# Patient Record
Sex: Female | Born: 1965 | Race: White | Hispanic: No | Marital: Married | Smoking: Current every day smoker
Health system: Southern US, Community
[De-identification: ages and names within clinical notes are randomized; demographics above are authoritative.]

---

## 2007-08-04 ENCOUNTER — Ambulatory Visit: Payer: Self-pay | Admitting: Family Medicine

## 2009-10-26 ENCOUNTER — Ambulatory Visit: Payer: Self-pay | Admitting: Nurse Practitioner

## 2010-02-10 ENCOUNTER — Emergency Department: Payer: Self-pay | Admitting: Internal Medicine

## 2010-07-16 ENCOUNTER — Ambulatory Visit: Payer: Self-pay | Admitting: Urology

## 2010-07-31 ENCOUNTER — Ambulatory Visit: Payer: Self-pay | Admitting: Urology

## 2010-12-23 ENCOUNTER — Ambulatory Visit: Payer: Self-pay | Admitting: Physician Assistant

## 2011-05-23 ENCOUNTER — Emergency Department: Payer: Self-pay | Admitting: Unknown Physician Specialty

## 2011-06-16 ENCOUNTER — Ambulatory Visit: Payer: Self-pay | Admitting: Nurse Practitioner

## 2017-10-04 ENCOUNTER — Other Ambulatory Visit: Payer: Self-pay

## 2017-10-04 ENCOUNTER — Emergency Department
Admission: EM | Admit: 2017-10-04 | Discharge: 2017-10-04 | Disposition: A | Discharge status: Home / Self Care | Payer: BLUE CROSS/BLUE SHIELD | Attending: Emergency Medicine | Admitting: Emergency Medicine

## 2017-10-04 ENCOUNTER — Emergency Department: Payer: BLUE CROSS/BLUE SHIELD

## 2017-10-04 DIAGNOSIS — R1011 Right upper quadrant pain: Secondary | ICD-10-CM | POA: Diagnosis not present

## 2017-10-04 DIAGNOSIS — F17200 Nicotine dependence, unspecified, uncomplicated: Secondary | ICD-10-CM | POA: Insufficient documentation

## 2017-10-04 DIAGNOSIS — R739 Hyperglycemia, unspecified: Secondary | ICD-10-CM | POA: Insufficient documentation

## 2017-10-04 LAB — PREGNANCY, URINE: Preg Test, Ur: NEGATIVE

## 2017-10-04 LAB — URINALYSIS, COMPLETE (UACMP) WITH MICROSCOPIC
BACTERIA UA: NONE SEEN
BILIRUBIN URINE: NEGATIVE
Glucose, UA: >=500 mg/dL — AB
HGB URINE DIPSTICK: NEGATIVE
KETONES UR: 5 mg/dL — AB
LEUKOCYTES UA: NEGATIVE
NITRITE: NEGATIVE
PH: 5 (ref 5.0–8.0)
Protein, ur: NEGATIVE mg/dL
Specific Gravity, Urine: 1.03 (ref 1.005–1.030)

## 2017-10-04 LAB — COMPREHENSIVE METABOLIC PANEL
ALT: 16 U/L (ref 14–54)
ANION GAP: 11 (ref 5–15)
AST: 16 U/L (ref 15–41)
Albumin: 4 g/dL (ref 3.5–5.0)
Alkaline Phosphatase: 82 U/L (ref 38–126)
BILIRUBIN TOTAL: 0.7 mg/dL (ref 0.3–1.2)
BUN: 26 mg/dL — AB (ref 6–20)
CO2: 22 mmol/L (ref 22–32)
Calcium: 9.7 mg/dL (ref 8.9–10.3)
Chloride: 98 mmol/L — ABNORMAL LOW (ref 101–111)
Creatinine, Ser: 0.55 mg/dL (ref 0.44–1.00)
GFR calc Af Amer: >60 mL/min (ref >60)
Glucose, Bld: 450 mg/dL — ABNORMAL HIGH (ref 65–99)
POTASSIUM: 4.3 mmol/L (ref 3.5–5.1)
Sodium: 131 mmol/L — ABNORMAL LOW (ref 135–145)
TOTAL PROTEIN: 7.5 g/dL (ref 6.5–8.1)

## 2017-10-04 LAB — CBC
HEMATOCRIT: 49.5 % — AB (ref 35.0–47.0)
Hemoglobin: 17.2 g/dL — ABNORMAL HIGH (ref 12.0–16.0)
MCH: 28.7 pg (ref 26.0–34.0)
MCHC: 34.7 g/dL (ref 32.0–36.0)
MCV: 82.7 fL (ref 80.0–100.0)
Platelets: 221 10*3/uL (ref 150–440)
RBC: 5.98 MIL/uL — ABNORMAL HIGH (ref 3.80–5.20)
RDW: 13.4 % (ref 11.5–14.5)
WBC: 11.7 10*3/uL — AB (ref 3.6–11.0)

## 2017-10-04 LAB — LIPASE, BLOOD: Lipase: 36 U/L (ref 11–51)

## 2017-10-04 MED ORDER — IOPAMIDOL (ISOVUE-300) INJECTION 61%
100.0000 mL | Freq: Once | INTRAVENOUS | Status: AC | PRN
  Administered 2017-10-04: 100 mL via INTRAVENOUS
  Filled 2017-10-04: qty 100

## 2017-10-04 MED ORDER — HYDROCODONE-ACETAMINOPHEN 5-325 MG PO TABS: 1.0000 | ORAL_TABLET | Freq: Once | ORAL | Status: DC

## 2017-10-04 MED ORDER — HYDROCODONE-ACETAMINOPHEN 5-325 MG PO TABS
1.0000 | ORAL_TABLET | Freq: Once | ORAL | Status: AC
  Administered 2017-10-04: 1 via ORAL
  Filled 2017-10-04: qty 1

## 2017-10-04 MED ORDER — HYDROCODONE-ACETAMINOPHEN 5-325 MG PO TABS: 1.0000 | ORAL_TABLET | Freq: Four times a day (QID) | ORAL | 0 refills | Status: AC | PRN

## 2017-10-04 MED ORDER — SODIUM CHLORIDE 0.9 % IV BOLUS (SEPSIS)
1000.0000 mL | Freq: Once | INTRAVENOUS | Status: AC
  Administered 2017-10-04: 1000 mL via INTRAVENOUS

## 2017-10-04 NOTE — ED Notes (Signed)
ED Provider at bedside. 

## 2017-10-04 NOTE — ED Provider Notes (Signed)
[facility] Emergency Department Provider Note  ____________________________________________   First MD Initiated Contact with Patient 10/04/17 1412     (approximate)  I have reviewed the triage vital signs and the nursing notes.   HISTORY  Chief Complaint Abdominal Pain   HPI Candace Martin is a 52 y.o. female who self presents to the emergency department with 3 weeks of intermittent right upper quadrant pain.  Her symptoms are mild to moderate severity.  Worse when sitting up and improved when lying down.  She has a remote surgical history of open cholecystectomy.  She is concerned she may have a hernia in her right upper quadrant.  She reports normal bowel movements and flatus.  Some nausea no vomiting.  No fevers or chills.  She is taking nonsteroidals with minimal relief.  Her pain seems to wrap around radiating from her right upper quadrant towards her back.  History reviewed. No pertinent past medical history.  There are no active problems to display for this patient.   History reviewed. No pertinent surgical history.  Prior to Admission medications   Medication Sig Start Date End Date Taking? Authorizing Provider  HYDROcodone-acetaminophen (NORCO) 5-325 MG tablet Take 1 tablet by mouth every 6 (six) hours as needed for up to 7 doses for severe pain. 10/04/17   Merrily Brittleifenbark, Arlina Sabina, MD    Allergies Neosporin [neomycin-bacitracin zn-polymyx]  History reviewed. No pertinent family history.  Social History Social History   Tobacco Use  . Smoking status: Current Every Day Smoker  Substance Use Topics  . Alcohol use: No    Frequency: Never  . Drug use: Not on file    Review of Systems Constitutional: No fever/chills Eyes: No visual changes. ENT: No sore throat. Cardiovascular: Denies chest pain. Respiratory: Denies shortness of breath. Gastrointestinal: Positive for abdominal pain.  As before nausea, no vomiting.  No diarrhea.  No  constipation. Genitourinary: Negative for dysuria. Musculoskeletal: Negative for back pain. Skin: Negative for rash. Neurological: Negative for headaches, focal weakness or numbness.   ____________________________________________   PHYSICAL EXAM:  VITAL SIGNS: ED Triage Vitals  Enc Vitals Group     BP 10/04/17 1242 (!) 161/86     Pulse Rate 10/04/17 1242 91     Resp 10/04/17 1242 18     Temp 10/04/17 1242 97.7 F (36.5 C)     Temp Source 10/04/17 1242 Oral     SpO2 10/04/17 1242 97 %     Weight 10/04/17 1243 190 lb (86.2 kg)     Height 10/04/17 1243 5\' 4"  (1.626 m)     Head Circumference --      Peak Flow --      Pain Score 10/04/17 1245 2     Pain Loc --      Pain Edu? --      Excl. in GC? --     Constitutional: Alert and oriented x4 somewhat anxious appearing but nontoxic no diaphoresis speaks in full clear sentences Eyes: PERRL EOMI. Head: Atraumatic. Nose: No congestion/rhinnorhea. Mouth/Throat: No trismus Neck: No stridor.   Cardiovascular: Normal rate, regular rhythm. Grossly normal heart sounds.  Good peripheral circulation. Respiratory: Normal respiratory effort.  No retractions. Lungs CTAB and moving good air Gastrointestinal: Obese abdomen well-healed open cholecystectomy scar mild tenderness right upper quadrant although with no obvious hernia no peritonitis Musculoskeletal: No lower extremity edema   Neurologic:  Normal speech and language. No gross focal neurologic deficits are appreciated. Skin:  Skin is warm, dry and  intact. No rash noted. Psychiatric: Mildly anxious   ____________________________________________   DIFFERENTIAL includes but not limited to  Small bowel obstruction, large bowel obstruction, pancreatitis, volvulus, hernia ____________________________________________   LABS (all labs ordered are listed, but only abnormal results are displayed)  Labs Reviewed  COMPREHENSIVE METABOLIC PANEL - Abnormal; Notable for the following  components:      Result Value   Sodium 131 (*)    Chloride 98 (*)    Glucose, Bld 450 (*)    BUN 26 (*)    All other components within normal limits  CBC - Abnormal; Notable for the following components:   WBC 11.7 (*)    RBC 5.98 (*)    Hemoglobin 17.2 (*)    HCT 49.5 (*)    All other components within normal limits  URINALYSIS, COMPLETE (UACMP) WITH MICROSCOPIC - Abnormal; Notable for the following components:   Color, Urine YELLOW (*)    APPearance CLEAR (*)    Glucose, UA >=500 (*)    Ketones, ur 5 (*)    Squamous Epithelial / LPF 0-5 (*)    All other components within normal limits  LIPASE, BLOOD  PREGNANCY, URINE  POC URINE PREG, ED   Lab work reviewed by me shows elevated glucose but no evidence of DKA __________________________________________  EKG  ED ECG REPORT I, Merrily Brittle, the attending physician, personally viewed and interpreted this ECG.  Date: 10/05/2017 EKG Time:  Rate: 89 Rhythm: normal sinus rhythm QRS Axis: Leftward axis Intervals: normal ST/T Wave abnormalities: normal Narrative Interpretation: no evidence of acute ischemia  ____________________________________________  RADIOLOGY  CT abdomen pelvis reviewed by me with no acute pathology no clear etiology of the patient's symptoms ____________________________________________   PROCEDURES  Procedure(s) performed: no  Procedures  Critical Care performed: no  Observation: no ____________________________________________   INITIAL IMPRESSION / ASSESSMENT AND PLAN / ED COURSE  Pertinent labs & imaging results that were available during my care of the patient were reviewed by me and considered in my medical decision making (see chart for details).  The patient arrives hemodynamically stable.  Her abdominal exam is benign although given her history of abdominal surgery and persistent discomfort I am concerned for an internal hernia versus volvulus etc.  CT scan with IV contrast is  pending.     Fortunately the patient CT scan is negative for acute surgical or bacterial pathology.  I had a lengthy discussion with the patient regarding the diagnostic uncertainty however as she is able to eat and drink and she is hemodynamically stable I am comfortable having her follow-up as an outpatient.  She verbalizes understanding and agreement with the plan.  Strict return precautions have been given. ____________________________________________   FINAL CLINICAL IMPRESSION(S) / ED DIAGNOSES  Final diagnoses:  Hyperglycemia  Right upper quadrant abdominal pain      NEW MEDICATIONS STARTED DURING THIS VISIT:  Discharge Medication List as of 10/04/2017  4:12 PM    START taking these medications   Details  HYDROcodone-acetaminophen (NORCO) 5-325 MG tablet Take 1 tablet by mouth every 6 (six) hours as needed for up to 7 doses for severe pain., Starting Sun 10/04/2017, Print         Note:  This document was prepared using Dragon voice recognition software and may include unintentional dictation errors.     Merrily Brittle, MD 10/05/17 [phone]

## 2017-10-04 NOTE — Discharge Instructions (Signed)
Fortunately today your CT scan was reassuring.  Please take your pain medication as needed for severe symptoms and follow-up with primary care in 2 days for reevaluation.  Return to the emergency department sooner for any concerns.  It was a pleasure to take care of you today, and thank you for coming to our emergency department.  If you have any questions or concerns before leaving please ask the nurse to grab me and I'm more than happy to go through your aftercare instructions again.  If you were prescribed any opioid pain medication today such as Norco, Vicodin, Percocet, morphine, hydrocodone, or oxycodone please make sure you do not drive when you are taking this medication as it can alter your ability to drive safely.  If you have any concerns once you are home that you are not improving or are in fact getting worse before you can make it to your follow-up appointment, please do not hesitate to call 911 and come back for further evaluation.  Merrily Brittle, MD  Results for orders placed or performed during the hospital encounter of 10/04/17  Lipase, blood  Result Value Ref Range   Lipase 36 11 - 51 U/L  Comprehensive metabolic panel  Result Value Ref Range   Sodium 131 (L) 135 - 145 mmol/L   Potassium 4.3 3.5 - 5.1 mmol/L   Chloride 98 (L) 101 - 111 mmol/L   CO2 22 22 - 32 mmol/L   Glucose, Bld 450 (H) 65 - 99 mg/dL   BUN 26 (H) 6 - 20 mg/dL   Creatinine, Ser 4.69 0.44 - 1.00 mg/dL   Calcium 9.7 8.9 - 62.9 mg/dL   Total Protein 7.5 6.5 - 8.1 g/dL   Albumin 4.0 3.5 - 5.0 g/dL   AST 16 15 - 41 U/L   ALT 16 14 - 54 U/L   Alkaline Phosphatase 82 38 - 126 U/L   Total Bilirubin 0.7 0.3 - 1.2 mg/dL   GFR calc non Af Amer >60 >60 mL/min   GFR calc Af Amer >60 >60 mL/min   Anion gap 11 5 - 15  CBC  Result Value Ref Range   WBC 11.7 (H) 3.6 - 11.0 K/uL   RBC 5.98 (H) 3.80 - 5.20 MIL/uL   Hemoglobin 17.2 (H) 12.0 - 16.0 g/dL   HCT 52.8 (H) 41.3 - 24.4 %   MCV 82.7 80.0 - 100.0 fL   MCH 28.7 26.0 - 34.0 pg   MCHC 34.7 32.0 - 36.0 g/dL   RDW 01.0 27.2 - 53.6 %   Platelets 221 150 - 440 K/uL  Urinalysis, Complete w Microscopic  Result Value Ref Range   Color, Urine YELLOW (A) YELLOW   APPearance CLEAR (A) CLEAR   Specific Gravity, Urine 1.030 1.005 - 1.030   pH 5.0 5.0 - 8.0   Glucose, UA >=500 (A) NEGATIVE mg/dL   Hgb urine dipstick NEGATIVE NEGATIVE   Bilirubin Urine NEGATIVE NEGATIVE   Ketones, ur 5 (A) NEGATIVE mg/dL   Protein, ur NEGATIVE NEGATIVE mg/dL   Nitrite NEGATIVE NEGATIVE   Leukocytes, UA NEGATIVE NEGATIVE   RBC / HPF 0-5 0 - 5 RBC/hpf   WBC, UA 0-5 0 - 5 WBC/hpf   Bacteria, UA NONE SEEN NONE SEEN   Squamous Epithelial / LPF 0-5 (A) NONE SEEN  Pregnancy, urine  Result Value Ref Range   Preg Test, Ur NEGATIVE NEGATIVE   Ct Abdomen Pelvis W Contrast  Result Date: 10/04/2017 CLINICAL DATA:  Abdominal distention.  Right  upper quadrant pain EXAM: CT ABDOMEN AND PELVIS WITH CONTRAST TECHNIQUE: Multidetector CT imaging of the abdomen and pelvis was performed using the standard protocol following bolus administration of intravenous contrast. CONTRAST:  100mL ISOVUE-300 IOPAMIDOL (ISOVUE-300) INJECTION 61% COMPARISON:  07/31/2010 noncontrast CT FINDINGS: Lower chest: Mitral annular calcification, prominent for age. No acute finding. Hepatobiliary: Ill-defined low-density that is nonenhancing along the gallbladder fossa that could be scarring or geographic fat, stable from 2012 when it was subtly seen. No masslike areas.Cholecystectomy with normal common bile duct diameter. Pancreas: Unremarkable. Spleen: Unremarkable. Adrenals/Urinary Tract: Negative adrenals. No hydronephrosis or stone. Unremarkable bladder. Stomach/Bowel: Diffuse colonic stool without obstruction. No rectal impaction. Sigmoid diverticulosis. No inflammatory changes. Appendectomy. Vascular/Lymphatic: No acute vascular abnormality. Diffuse atherosclerotic calcification. No mass or adenopathy.  Reproductive:Negative. Other: No ascites or pneumoperitoneum. Musculoskeletal: No acute abnormalities. 5 cm simple subcutaneous lipoma along the lower anterior margin of the left latissimus dorsi. IMPRESSION: 1. No acute finding . 2.  Aortic Atherosclerosis (ICD10-I70.0). 3. Sigmoid diverticulosis. Electronically Signed   By: Marnee SpringJonathon  Watts M.D.   On: 10/04/2017 15:47

## 2017-10-04 NOTE — ED Notes (Signed)
Patient transported to CT 

## 2017-10-04 NOTE — ED Triage Notes (Signed)
Pt states RUQ abd pain under breast that wraps around to back. States has noticed swelling. Symptoms x 2 weeks. Some nausea. Denies vomiting or diarrhea. Alert, oriented, ambulatory. No distress noted. Unsure if has hernia or not.

## 2018-10-27 DEATH — deceased

## 2019-04-26 IMAGING — CT CT ABD-PELV W/ CM
2 of 5 series · 16 of 46 positions shown, 18 images · IV contrast (APPLIED)
Comparison: 07/31/2010 noncontrast CT

CLINICAL DATA: Abdominal distention.  Right upper quadrant pain

EXAM:
CT ABDOMEN AND PELVIS WITH CONTRAST
TECHNIQUE: Multidetector CT imaging of the abdomen and pelvis was performed
using the standard protocol following bolus administration of
intravenous contrast.
CONTRAST:  100mL YPKYK5-DAA IOPAMIDOL (YPKYK5-DAA) INJECTION 61%

[Series 2: routine abd/pel with · axial · 0.86mm/px · z∈[-475,-55]mm · 13 of 96 slices shown, 15 images]
[im 6/96  soft-tissue]
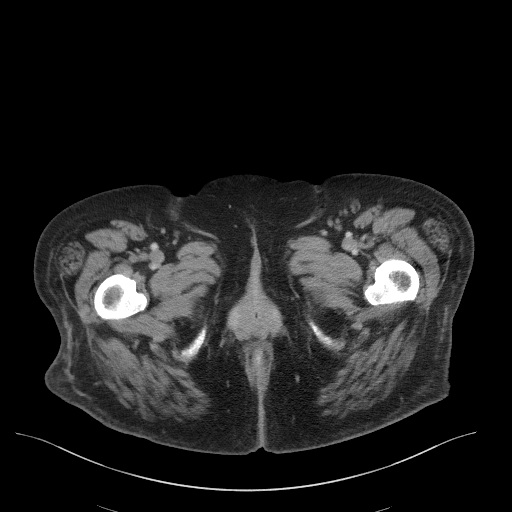
[im 6/96  bone]
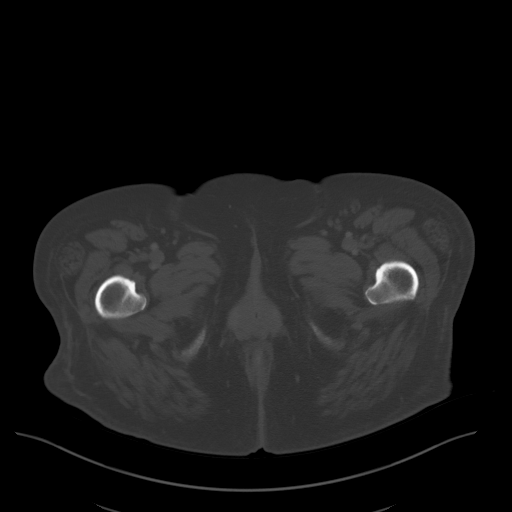
[im 11/96  soft-tissue]
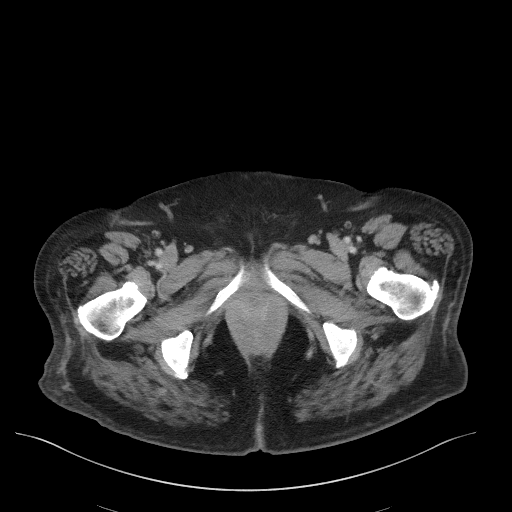
[im 22/96  soft-tissue]
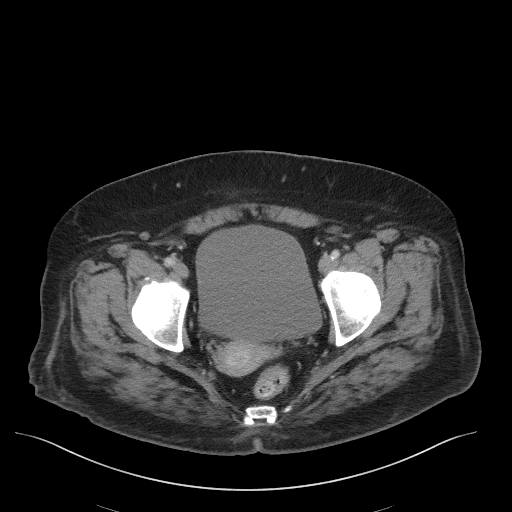
[im 27/96  soft-tissue]
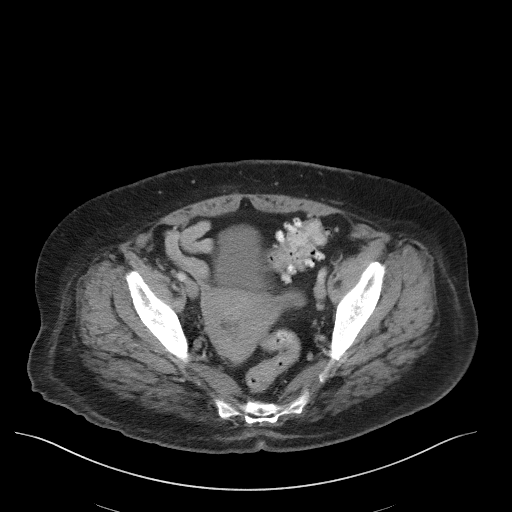
[im 32/96  soft-tissue]
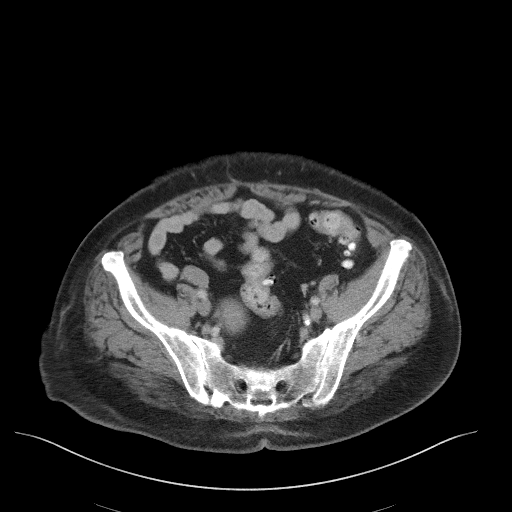
[im 43/96  soft-tissue]
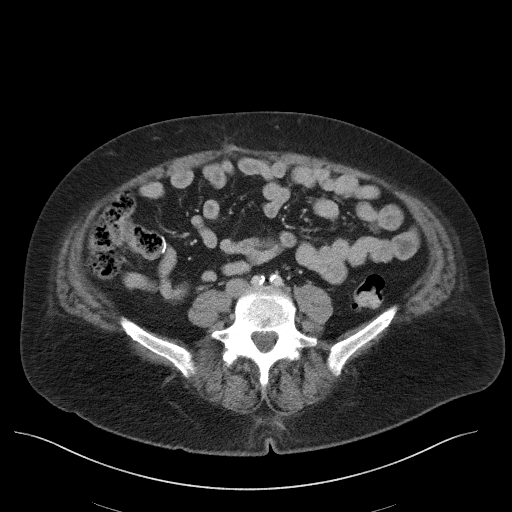
[im 48/96  soft-tissue]
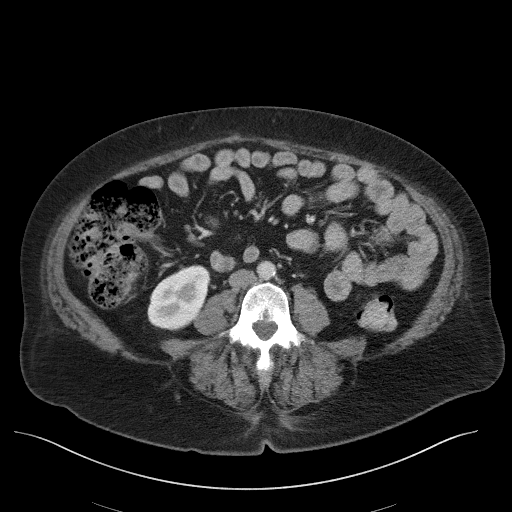
[im 53/96  soft-tissue]
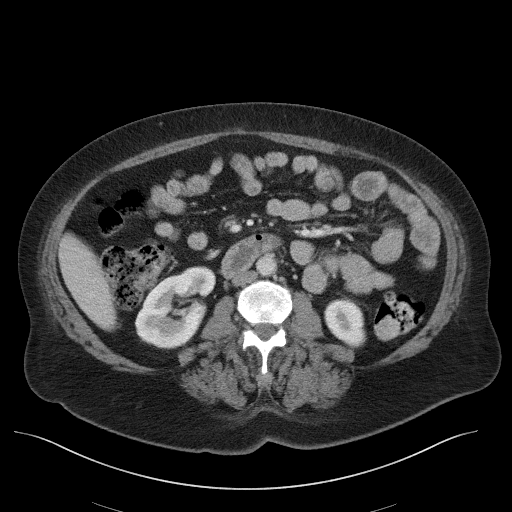
[im 64/96  soft-tissue]
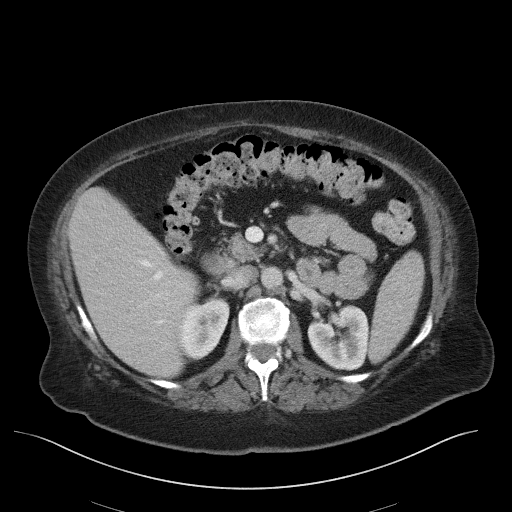
[im 64/96  bone]
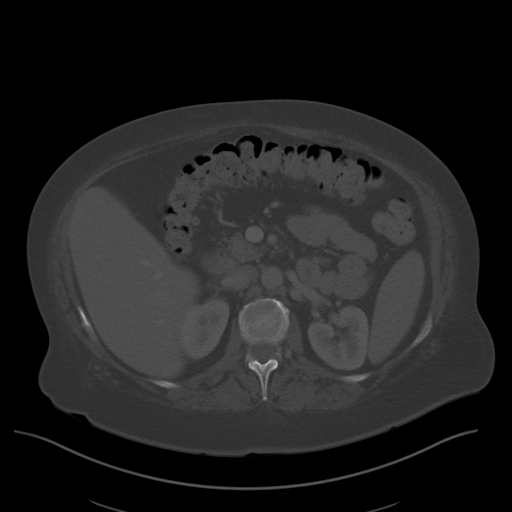
[im 69/96  soft-tissue]
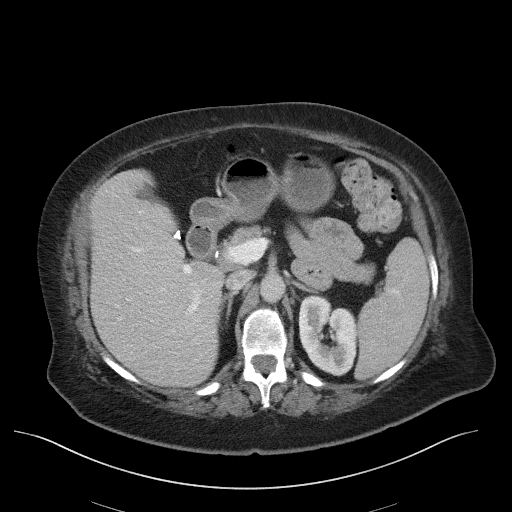
[im 74/96  soft-tissue]
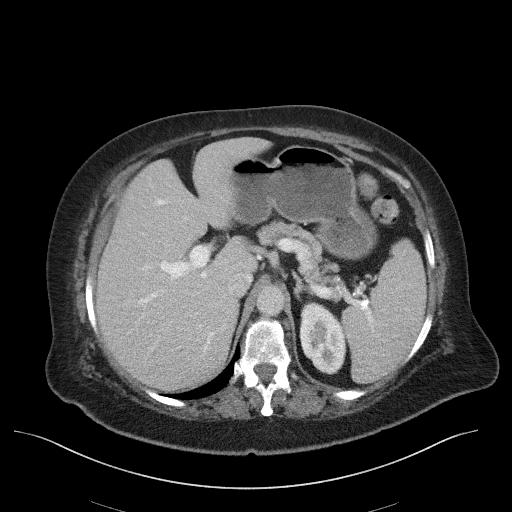
[im 85/96  soft-tissue]
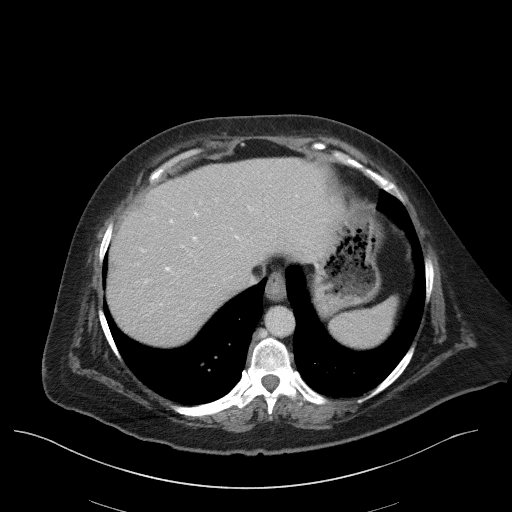
[im 90/96  soft-tissue]
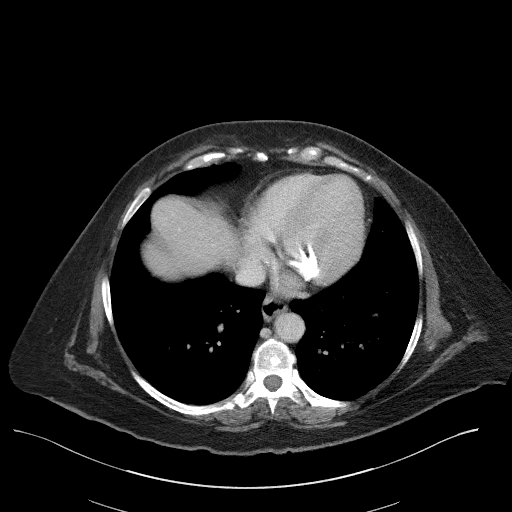

[Series 5: coronal st · coronal · 0.82mm/px · 3 of 98 slices shown]
[im 33/98  soft-tissue]
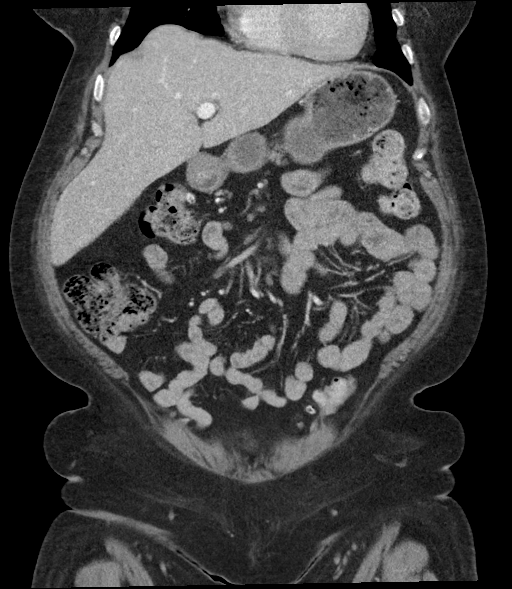
[im 44/98  soft-tissue]
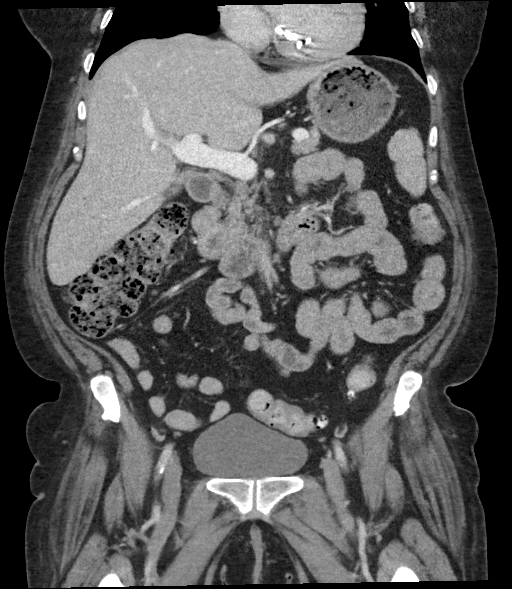
[im 54/98  soft-tissue]
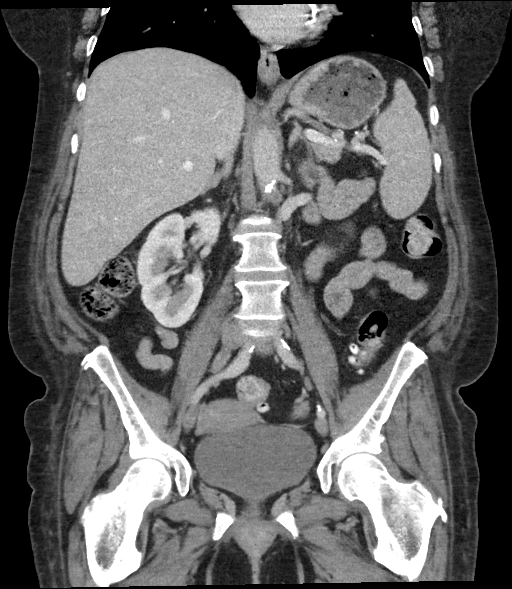

[16 of 46 positions shown; findings below may reference images not displayed]

FINDINGS: Lower chest: Mitral annular calcification, prominent for age. No
acute finding.

Hepatobiliary: Ill-defined low-density that is nonenhancing along
the gallbladder fossa that could be scarring or geographic fat,
stable from 6256 when it was subtly seen. No masslike
areas.Cholecystectomy with normal common bile duct diameter.

Pancreas: Unremarkable.

Spleen: Unremarkable.

Adrenals/Urinary Tract: Negative adrenals. No hydronephrosis or
stone. Unremarkable bladder.

Stomach/Bowel: Diffuse colonic stool without obstruction. No rectal
impaction. Sigmoid diverticulosis. No inflammatory changes.
Appendectomy.

Vascular/Lymphatic: No acute vascular abnormality. Diffuse
atherosclerotic calcification. No mass or adenopathy.

Reproductive:Negative.

Other: No ascites or pneumoperitoneum.

Musculoskeletal: No acute abnormalities. 5 cm simple subcutaneous
lipoma along the lower anterior margin of the left latissimus dorsi.
IMPRESSION: 1. No acute finding .
2.  Aortic Atherosclerosis ([id number]).
3. Sigmoid diverticulosis.
